# Patient Record
Sex: Female | Born: 1980 | Race: White | Hispanic: No | Marital: Married | Smoking: Never smoker
Health system: Southern US, Community
[De-identification: ages and names within clinical notes are randomized; demographics above are authoritative.]

## PROBLEM LIST (undated history)

## (undated) DIAGNOSIS — Z8619 Personal history of other infectious and parasitic diseases: Secondary | ICD-10-CM

## (undated) DIAGNOSIS — Z975 Presence of (intrauterine) contraceptive device: Secondary | ICD-10-CM

## (undated) HISTORY — DX: Personal history of other infectious and parasitic diseases: Z86.19

## (undated) HISTORY — PX: WISDOM TOOTH EXTRACTION: SHX21

## (undated) HISTORY — DX: Presence of (intrauterine) contraceptive device: Z97.5

---

## 2006-11-23 ENCOUNTER — Observation Stay: Payer: Self-pay

## 2006-11-24 ENCOUNTER — Inpatient Hospital Stay: Payer: Self-pay

## 2009-03-31 ENCOUNTER — Emergency Department: Payer: Self-pay | Admitting: Emergency Medicine

## 2010-06-27 LAB — HM PAP SMEAR

## 2010-12-04 ENCOUNTER — Ambulatory Visit (INDEPENDENT_AMBULATORY_CARE_PROVIDER_SITE_OTHER): Payer: BC Managed Care – PPO | Admitting: Family Medicine

## 2010-12-04 ENCOUNTER — Encounter: Payer: Self-pay | Admitting: Family Medicine

## 2010-12-04 DIAGNOSIS — G43109 Migraine with aura, not intractable, without status migrainosus: Secondary | ICD-10-CM | POA: Insufficient documentation

## 2010-12-04 DIAGNOSIS — Z23 Encounter for immunization: Secondary | ICD-10-CM

## 2010-12-04 DIAGNOSIS — Z975 Presence of (intrauterine) contraceptive device: Secondary | ICD-10-CM | POA: Insufficient documentation

## 2010-12-04 NOTE — Patient Instructions (Addendum)
Try to get regular exercise.  I would get a flu shot each fall.   Let me know if you have concerns.  We'll request your records.   Take care.  Glad to see you.

## 2010-12-04 NOTE — Assessment & Plan Note (Signed)
Pt to check if it was a Td or Tdap.

## 2010-12-04 NOTE — Assessment & Plan Note (Signed)
Controlled with rare flare.  Doing well with abortive tx.  Will call back if inc in frequency.  Nonsmoker, d/w pt about exercise.  Okay for outpatient fu.  Requesting records.

## 2010-12-04 NOTE — Progress Notes (Signed)
New to est.  H/o migraines, with aura.  Much improved frequency with IUD.  1-2/month, resolved with prn fioricet.  Doing well o/w.  Nonsmoker.   H/o IUD per Gyn.   Recently to UC with +RST, started on omnicef.  Now w/o ST and back to baseline.   PMH and SH reviewed  ROS: See HPI, otherwise noncontributory.  Meds, vitals, and allergies reviewed.   GEN: nad, alert and oriented HEENT: mucous membranes moist, op wnl NECK: supple w/o LA CV: rrr.  no murmur PULM: ctab, no inc wob ABD: soft, +bs EXT: no edema SKIN: no acute rash

## 2011-03-01 ENCOUNTER — Encounter: Payer: Self-pay | Admitting: Family Medicine

## 2011-03-21 ENCOUNTER — Ambulatory Visit (INDEPENDENT_AMBULATORY_CARE_PROVIDER_SITE_OTHER): Payer: BC Managed Care – PPO | Admitting: Family Medicine

## 2011-03-21 ENCOUNTER — Encounter: Payer: Self-pay | Admitting: Family Medicine

## 2011-03-21 DIAGNOSIS — J029 Acute pharyngitis, unspecified: Secondary | ICD-10-CM | POA: Insufficient documentation

## 2011-03-21 MED ORDER — AZITHROMYCIN 250 MG PO TABS: ORAL_TABLET | ORAL | Status: AC

## 2011-03-21 NOTE — Patient Instructions (Signed)
Drink plenty of fluids, take tylenol as needed, and gargle with warm salt water for your throat.  This should gradually improve.  Take care.  Let us know if you have other concerns.  Start the zithromax today.

## 2011-03-21 NOTE — Progress Notes (Signed)
Mult sick exposures, children with strep.  Now with sx starting Friday with HA and ST.  Then dizzy with chills.  No documented fevers, but has had sweats.  Minimal cough.  Diffuse aches; minimal ear pain.    Amoxil allergy.    Meds, vitals, and allergies reviewed.   ROS: See HPI.  Otherwise, noncontributory.  nad ncat Tm wnl Nasal exam with mild irritation Op with cobblestoning and erythema, no exudates Neck with LA noted rrr ctab Ext well perfused

## 2011-03-21 NOTE — Assessment & Plan Note (Signed)
Presumed stared with ST, tender LA, no cough, presumed fevers.  Known exposure.  Supportive tx and zmax, encouraged flu shot after recovery.  Nontoxic.  She agrees with plan.

## 2012-04-09 ENCOUNTER — Ambulatory Visit (INDEPENDENT_AMBULATORY_CARE_PROVIDER_SITE_OTHER): Payer: BC Managed Care – PPO | Admitting: Family Medicine

## 2012-04-09 ENCOUNTER — Encounter: Payer: Self-pay | Admitting: Family Medicine

## 2012-04-09 VITALS — BP 90/62 | HR 81 | Temp 98.6°F | Wt 130.0 lb

## 2012-04-09 DIAGNOSIS — J029 Acute pharyngitis, unspecified: Secondary | ICD-10-CM

## 2012-04-09 LAB — POCT RAPID STREP A (OFFICE): Rapid Strep A Screen: NEGATIVE

## 2012-04-09 MED ORDER — CEFDINIR 300 MG PO CAPS: 600.0000 mg | ORAL_CAPSULE | Freq: Every day | ORAL | Status: DC

## 2012-04-09 NOTE — Progress Notes (Signed)
Nature conservation officer at [facility] [address] [place] [address] Phone: [phone] Fax: [phone]  Date:  04/09/2012   Name:  Renee Carr   DOB:  Jan 05, 1981   MRN:  [id number] Gender: female Age: 31 y.o.  PCP:  Crawford Givens, MD  Evaluating MD: Hannah Beat, MD   Chief Complaint: Sore Throat   History of Present Illness:  Renee Carr is a 31 y.o. pleasant patient who presents with the following:  Sore throat, muscle soreness in neck. Very difficult to swallow. Significant pain, no nasal symptoms, no otalgia, no n/v/d, no rash  Patient Active Problem List  Diagnosis  . Migraine with aura  . IUD (intrauterine device) in place  . Tetanus-diphtheria (Td) vaccination  . Pharyngitis    Past Medical History  Diagnosis Date  . History of chicken pox   . Migraine   . IUD (intrauterine device) in place     mirena 04/2007 per gyn    Past Surgical History  Procedure Date  . Wisdom tooth extraction     History  Substance Use Topics  . Smoking status: Never Smoker   . Smokeless tobacco: Never Used  . Alcohol Use: Yes     Comment: very rarely    Family History  Problem Relation Age of Onset  . Healthy Mother   . Colon cancer Neg Hx     Allergies  Allergen Reactions  . Amoxicillin Hives    And joint pain; but has tolerated omnicef prev w/o ADE    Medication list has been reviewed and updated.  Outpatient Prescriptions Prior to Visit  Medication Sig Dispense Refill  . butalbital-acetaminophen-caffeine (FIORICET) 50-325-40 MG per tablet Take 1-2 by mouth every 4-6 hours as needed for pain       . Pediatric Multiple Vitamins (CHEWABLE MULTIPLE VITAMINS PO) Take by mouth 2 (two) times daily.         Last reviewed on 04/09/2012 12:59 PM by Consuello Masse, CMA  Review of Systems:  As above  Physical Examination: Filed Vitals:   04/09/12 1258  BP: 90/62  Pulse: 81  Temp: 98.6 F (37 C)  TempSrc: Oral  Weight: 130 lb (58.968 kg)      There is no height on file to calculate BMI. Ideal Body Weight:     Gen: WDWN, NAD; A & O x3, cooperative. Pleasant.Globally Non-toxic HEENT: Normocephalic and atraumatic. Throat: no exudate R TM clear, L TM - good landmarks, No fluid present. rhinnorhea. No frontal or maxillary sinus T. MMM NECK: Anterior cervical  LAD is present - TTP CV: RRR, No M/G/R, cap refill <2 sec PULM: Breathing comfortably in no respiratory distress. no wheezing, crackles, rhonchi ABD: S,NT,ND,+BS. No HSM. No rebound. EXT: No c/c/e PSYCH: Friendly, good eye contact   Assessment and Plan:  1. 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 POCT rapid strep A    Updated Medication List: (Includes new medications, updates to list, dose adjustments) Meds ordered this encounter  Medications  . cefdinir (OMNICEF) 300 MG capsule    Sig: Take 2 capsules (600 mg total) by  mouth daily.    Dispense:  20 capsule    Refill:  0    Medications Discontinued: There are no discontinued medications.    Hannah Beat, MD

## 2012-05-27 ENCOUNTER — Other Ambulatory Visit: Payer: Self-pay

## 2012-05-27 MED ORDER — BUTALBITAL-APAP-CAFFEINE 50-325-40 MG PO TABS: ORAL_TABLET | ORAL | Status: DC

## 2012-05-27 NOTE — Telephone Encounter (Signed)
Please call in.  F/u if not improved.  Thanks.

## 2012-05-27 NOTE — Telephone Encounter (Signed)
Rx phoned to pharmacy. Patient advised.

## 2012-05-27 NOTE — Telephone Encounter (Signed)
Pt said she has the beginnings of a migraine h/a and is out of med; pt said she understood Dr Para March to tell her if she needed refills she just needed to call. I offered pt an appt with Dr Para March but she said she would schedule appt but wanted to ask Dr Para March first if he would refill fiorcet to [facility].Please advise.

## 2012-11-13 ENCOUNTER — Ambulatory Visit (INDEPENDENT_AMBULATORY_CARE_PROVIDER_SITE_OTHER): Payer: BC Managed Care – PPO | Admitting: Family Medicine

## 2012-11-13 ENCOUNTER — Encounter: Payer: Self-pay | Admitting: Family Medicine

## 2012-11-13 VITALS — BP 92/58 | HR 88 | Temp 98.4°F | Ht 66.5 in | Wt 136.5 lb

## 2012-11-13 DIAGNOSIS — Z13220 Encounter for screening for lipoid disorders: Secondary | ICD-10-CM

## 2012-11-13 DIAGNOSIS — R5381 Other malaise: Secondary | ICD-10-CM

## 2012-11-13 DIAGNOSIS — Z0000 Encounter for general adult medical examination without abnormal findings: Secondary | ICD-10-CM

## 2012-11-13 DIAGNOSIS — Z131 Encounter for screening for diabetes mellitus: Secondary | ICD-10-CM

## 2012-11-13 NOTE — Progress Notes (Signed)
CPE- See plan.  Routine anticipatory guidance given to patient.  See health maintenance. Pap done 3/14 at gyn clinic.  Mammogram not due.   Colon cancer screening not due.   Tetanus shot done 2007. Flu shot d/w pt.   Diet and exercise d/w pt.  Healthy diet.  Exercising usually with dancing.   Living will.  Encouraged.  Would have husband designated if incapacitated.   Due for lipid and sugar check.    She has some post meal fatigue, also with some fatigue and need to nap throughout the day.  She thought taking B12 helped some but not fully.  Sleeping well. No clear trigger.  She is busy but life is generally good.    PMH and SH reviewed  Meds, vitals, and allergies reviewed.   ROS: See HPI.  Otherwise negative.    GEN: nad, alert and oriented HEENT: mucous membranes moist NECK: supple w/o LA CV: rrr. PULM: ctab, no inc wob ABD: soft, +bs EXT: no edema SKIN: no acute rash.  She has a 2mm brown macule on the R forearm, similar lesion 5mm across on the R shin.  Both appear uniform, benign.

## 2012-11-13 NOTE — Patient Instructions (Addendum)
Come back for fasting labs- schedule that on the way out.  We'll contact you with your lab report. I would get a flu shot each fall.    Take care.  Glad to see you.

## 2012-11-16 DIAGNOSIS — Z0000 Encounter for general adult medical examination without abnormal findings: Secondary | ICD-10-CM | POA: Insufficient documentation

## 2012-11-16 DIAGNOSIS — R5381 Other malaise: Secondary | ICD-10-CM | POA: Insufficient documentation

## 2012-11-16 DIAGNOSIS — Z0001 Encounter for general adult medical examination with abnormal findings: Secondary | ICD-10-CM | POA: Insufficient documentation

## 2012-11-16 NOTE — Assessment & Plan Note (Signed)
Possibly with workload/schedule component.  Check for reversible causes.    Return for labs.   She agrees.  Nontoxic. No tmg on exam.

## 2012-11-16 NOTE — Assessment & Plan Note (Signed)
Routine anticipatory guidance given to patient.  See health maintenance. Pap done 3/14 at gyn clinic.  Mammogram not due.   Colon cancer screening not due.   Tetanus shot done 2007. Flu shot d/w pt.   Diet and exercise d/w pt.  Healthy diet.  Exercising usually with dancing.   Living will.  Encouraged.  Would have husband designated if incapacitated.   Due for lipid and sugar check.   Return for labs.

## 2012-11-17 ENCOUNTER — Other Ambulatory Visit (INDEPENDENT_AMBULATORY_CARE_PROVIDER_SITE_OTHER): Payer: BC Managed Care – PPO

## 2012-11-17 DIAGNOSIS — R5383 Other fatigue: Secondary | ICD-10-CM

## 2012-11-17 DIAGNOSIS — Z13220 Encounter for screening for lipoid disorders: Secondary | ICD-10-CM

## 2012-11-17 DIAGNOSIS — Z131 Encounter for screening for diabetes mellitus: Secondary | ICD-10-CM

## 2012-11-17 LAB — LIPID PANEL
LDL Cholesterol: 94 mg/dL (ref 0–99)
Total CHOL/HDL Ratio: 3
VLDL: 8.4 mg/dL (ref 0.0–40.0)

## 2012-11-17 LAB — HEMOGLOBIN: Hemoglobin: 12.8 g/dL (ref 12.0–15.0)

## 2012-11-17 LAB — TSH: TSH: 3.71 u[IU]/mL (ref 0.35–5.50)

## 2012-11-17 LAB — VITAMIN B12: Vitamin B-12: 931 pg/mL — ABNORMAL HIGH (ref 211–911)

## 2015-08-11 ENCOUNTER — Encounter: Payer: Self-pay | Admitting: Obstetrics and Gynecology

## 2015-10-05 ENCOUNTER — Encounter: Payer: Self-pay | Admitting: Obstetrics and Gynecology

## 2015-10-13 ENCOUNTER — Encounter: Payer: Self-pay | Admitting: Obstetrics and Gynecology

## 2015-12-28 ENCOUNTER — Encounter: Payer: Self-pay | Admitting: Family Medicine

## 2015-12-28 ENCOUNTER — Ambulatory Visit (INDEPENDENT_AMBULATORY_CARE_PROVIDER_SITE_OTHER): Payer: BLUE CROSS/BLUE SHIELD | Admitting: Family Medicine

## 2015-12-28 VITALS — BP 108/64 | HR 87 | Temp 98.5°F | Ht 66.5 in | Wt 141.4 lb

## 2015-12-28 DIAGNOSIS — R002 Palpitations: Secondary | ICD-10-CM

## 2015-12-28 DIAGNOSIS — G43109 Migraine with aura, not intractable, without status migrainosus: Secondary | ICD-10-CM

## 2015-12-28 DIAGNOSIS — Z23 Encounter for immunization: Secondary | ICD-10-CM | POA: Diagnosis not present

## 2015-12-28 DIAGNOSIS — Z0000 Encounter for general adult medical examination without abnormal findings: Secondary | ICD-10-CM

## 2015-12-28 DIAGNOSIS — Z0001 Encounter for general adult medical examination with abnormal findings: Secondary | ICD-10-CM | POA: Diagnosis not present

## 2015-12-28 MED ORDER — BUTALBITAL-APAP-CAFFEINE 50-325-40 MG PO TABS: ORAL_TABLET | ORAL | 0 refills | Status: DC

## 2015-12-28 NOTE — Progress Notes (Signed)
CPE- See plan.  Routine anticipatory guidance given to patient.  See health maintenance. Pap done per gyn clinic.  Mammogram not due.   Colon cancer screening not due.   Tetanus shot 2017. Flu shot d/w pt.  Encouraged.    PNA and shingles not due.   Diet and exercise d/w pt.  Healthy diet.  Exercising usually with dancing.   Living will d/w pt.  Would have husband designated if she were incapacitated.   HCV screening not indicated.   HIV prev done with prenatal labs ~2008.  D/w pt.   She still has some fatigue.  No clear cause seen prev.  Unclear if this is a "normal" level of fatigue given work and life considerations.   She has occ palpitations, heart beating faster and stronger, for a few seconds, can happen at rest, randomly.  No exertional CP.  No SOB.  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  No syncope.  No presyncope.  Coffee in the AM, not much caffeine o/w.  She'll have episodes daily, or most days of the week.  Some days with mult episodes.  Not escalating in frequency, but going on for the last ~6 months.    occ migraine, had rarely used fioricet prev with relief.    PMH and SH reviewed  Meds, vitals, and allergies reviewed.   ROS: Per HPI.  Unless specifically indicated otherwise in HPI, the patient denies:  General: fever. Eyes: acute vision changes ENT: sore throat Cardiovascular: chest pain Respiratory: SOB GI: vomiting GU: dysuria Musculoskeletal: acute back pain Derm: acute rash Neuro: acute motor dysfunction Psych: worsening mood Endocrine: polydipsia Heme: bleeding Allergy: hayfever  GEN: nad, alert and oriented HEENT: mucous membranes moist NECK: supple w/o LA CV: rrr. PULM: ctab, no inc wob ABD: soft, +bs EXT: no edema SKIN: no acute rash

## 2015-12-28 NOTE — Patient Instructions (Addendum)
Please let us know if you need a referral for the gynecology clinic.  Please have them send us your next visit note.  Go to the lab on the way out.  We'll contact you with your lab report. Cut back on caffeine in the meantime.  I wouldn't quit cold [place].   Take care.  Glad to see you.

## 2015-12-29 DIAGNOSIS — R002 Palpitations: Secondary | ICD-10-CM | POA: Insufficient documentation

## 2015-12-29 LAB — BASIC METABOLIC PANEL
BUN: 12 mg/dL (ref 6–23)
CHLORIDE: 105 meq/L (ref 96–112)
CO2: 27 meq/L (ref 19–32)
CREATININE: 0.82 mg/dL (ref 0.40–1.20)
Calcium: 9.8 mg/dL (ref 8.4–10.5)
GFR: 83.69 mL/min (ref >60.00)
GLUCOSE: 75 mg/dL (ref 70–99)
Potassium: 4 mEq/L (ref 3.5–5.1)
Sodium: 140 mEq/L (ref 135–145)

## 2015-12-29 LAB — TSH: TSH: 2.43 u[IU]/mL (ref 0.35–4.50)

## 2015-12-29 LAB — HEMOGLOBIN: Hemoglobin: 12.5 g/dL (ref 12.0–15.0)

## 2015-12-29 NOTE — Assessment & Plan Note (Signed)
EKG unremarkable, TSH pending.  See notes on labs.  She'll cut back on caffeine and then we'll go from there.  We can address if sx continue (if labs wnl, if not better with less caffeine, etc).  She may have occ PVCs.  She doesn't have sustained sx, no CP, good exercise tolerance and is still okay for outpatient f/u.  D/w pt.

## 2015-12-29 NOTE — Assessment & Plan Note (Signed)
occ migraine, had rarely used fioricet prev with relief.  Okay to use prn, is used rarely.  If needed frequently, then she'll update me.

## 2015-12-29 NOTE — Assessment & Plan Note (Signed)
Pap done per gyn clinic.  Mammogram not due.   Colon cancer screening not due.   Tetanus shot 2017. Flu shot d/w pt.  Encouraged.    PNA and shingles not due.   Diet and exercise d/w pt.  Healthy diet.  Exercising usually with dancing.   Living will d/w pt.  Would have husband designated if she were incapacitated.   HCV screening not indicated.   HIV prev done with prenatal labs ~2008.  D/w pt.   She still has some fatigue.  No clear cause seen prev.  Unclear if this is a "normal" level of fatigue given work and life considerations.

## 2016-11-22 ENCOUNTER — Ambulatory Visit (INDEPENDENT_AMBULATORY_CARE_PROVIDER_SITE_OTHER): Payer: BLUE CROSS/BLUE SHIELD | Admitting: Obstetrics and Gynecology

## 2016-11-22 ENCOUNTER — Other Ambulatory Visit: Payer: Self-pay | Admitting: Obstetrics and Gynecology

## 2016-11-22 ENCOUNTER — Encounter: Payer: Self-pay | Admitting: Obstetrics and Gynecology

## 2016-11-22 VITALS — BP 93/61 | HR 75 | Ht 66.5 in | Wt 143.0 lb

## 2016-11-22 DIAGNOSIS — Z01419 Encounter for gynecological examination (general) (routine) without abnormal findings: Secondary | ICD-10-CM

## 2016-11-22 DIAGNOSIS — Z30431 Encounter for routine checking of intrauterine contraceptive device: Secondary | ICD-10-CM

## 2016-11-22 NOTE — Patient Instructions (Signed)
  Place annual gynecologic exam patient instructions here.  Thank you for enrolling in MyChart. Please follow the instructions below to securely access your online medical record. MyChart allows you to send messages to your doctor, view your test results, manage appointments, and more.   How Do I Sign Up? 1. In your Internet browser, go to [employer] Address [employer] and enter [email or web address]. 2. Click on the Sign Up Now link in the Sign In box. You will see the New Member Sign Up page. 3. Enter your MyChart Access Code exactly as it appears below. You will not need to use this code after you've completed the sign-up process. If you do not sign up before the expiration date, you must request a new code.  MyChart Access Code: [email or web address] Expires: 01/21/2017  2:54 PM  4. Enter your Social Security Number ([id number]) and Date of Birth (mm/dd/yyyy) as indicated and click Submit. You will be taken to the next sign-up page. 5. Create a MyChart ID. This will be your MyChart login ID and cannot be changed, so think of one that is secure and easy to remember. 6. Create a MyChart password. You can change your password at any time. 7. Enter your Password Reset Question and Answer. This can be used at a later time if you forget your password.  8. Enter your e-mail address. You will receive e-mail notification when new information is available in MyChart. 9. Click Sign Up. You can now view your medical record.   Additional Information Remember, MyChart is NOT to be used for urgent needs. For medical emergencies, dial 911.

## 2016-11-22 NOTE — Progress Notes (Signed)
Subjective:   Renee Carr is a 36 y.o. G1P1 Caucasian female here for a routine well-woman exam.  No LMP recorded. Patient is not currently having periods (Reason: IUD).    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  Past Medical History Past Medical History:  Diagnosis Date  . History of chicken pox   . IUD (intrauterine device) in place    mirena 04/2007 per gyn  . Migraine    occ with aura    Past Surgical History Past Surgical History:  Procedure Laterality Date  . WISDOM TOOTH EXTRACTION      Gynecologic History G1P1  No LMP recorded. Patient is not currently having periods (Reason: IUD). Contraception: IUD Last Pap: 2012. Results were: normal   Obstetric History OB History  Gravida Para Term Preterm AB Living  1 1       1   SAB TAB Ectopic Multiple Live Births          1    # Outcome Date GA Lbr Len/2nd Weight Sex Delivery Anes PTL Lv  1 Para 2008    F Vag-Spont   LIV      Current Medications Current Outpatient Prescriptions on File Prior to Visit  Medication Sig Dispense Refill  . BIOTIN PO Take 2 capsules by mouth daily.    . butalbital-acetaminophen-caffeine (FIORICET) 50-325-40 MG tablet Take 1-2 by mouth every 4-6 hours as needed for pain (Patient not taking: Reported on 11/22/2016) 30 tablet 0   No current facility-administered medications on file prior to visit.     Review of Systems Patient denies any headaches, blurred vision, shortness of breath, chest pain, abdominal pain, problems with  bowel movements, urination, or intercourse.  Objective:  BP 93/61 (BP Location: Left Arm, Patient Position: Sitting, Cuff Size: Normal)   Pulse 75   Ht 5' 6.5" (1.689 m)   Wt 143 lb (64.9 kg)   BMI 22.74 kg/m  Physical Exam  General:  Well developed, well nourished, no acute distress. She is alert and oriented x3. Skin:  Warm and dry Neck:  Midline trachea, no thyromegaly or nodules Cardiovascular: Regular rate and rhythm, no murmur heard Lungs:  Effort normal, all lung fields clear to auscultation bilaterally Breasts:  No dominant palpable mass, retraction, or nipple discharge Abdomen:  Soft, non tender, no hepatosplenomegaly or masses Pelvic:  External genitalia is normal in appearance.  The vagina is normal in appearance. The cervix is bulbous, no CMT.  Thin prep pap is done with HR HPV cotesting. Uterus is felt to be normal size, shape, and contour.  No adnexal masses or tenderness noted.IUD string noted Extremities:  No swelling or varicosities noted Psych:  She has a normal mood and affect  Assessment:   Healthy well-woman exam IUD check   Plan:  Discussed replacing IUD, will call to schedule.  F/U 1 year for AE, or sooner if needed Mammogram ordered baseline   Melody Suzan NailerN Shambley, CNM

## 2016-11-23 LAB — COMPREHENSIVE METABOLIC PANEL
ALBUMIN: 4.9 g/dL (ref 3.5–5.5)
ALK PHOS: 56 IU/L (ref 39–117)
ALT: 11 IU/L (ref 0–32)
AST: 15 IU/L (ref 0–40)
Albumin/Globulin Ratio: 2 (ref 1.2–2.2)
BILIRUBIN TOTAL: 0.3 mg/dL (ref 0.0–1.2)
BUN / CREAT RATIO: 17 (ref 9–23)
BUN: 16 mg/dL (ref 6–20)
CHLORIDE: 101 mmol/L (ref 96–106)
CO2: 24 mmol/L (ref 20–29)
CREATININE: 0.92 mg/dL (ref 0.57–1.00)
Calcium: 9.3 mg/dL (ref 8.7–10.2)
GFR calc Af Amer: 92 mL/min/{1.73_m2} (ref >59)
GFR calc non Af Amer: 80 mL/min/{1.73_m2} (ref >59)
GLOBULIN, TOTAL: 2.4 g/dL (ref 1.5–4.5)
Glucose: 83 mg/dL (ref 65–99)
Potassium: 4 mmol/L (ref 3.5–5.2)
Sodium: 140 mmol/L (ref 134–144)
Total Protein: 7.3 g/dL (ref 6.0–8.5)

## 2016-11-23 LAB — LIPID PANEL
CHOLESTEROL TOTAL: 150 mg/dL (ref 100–199)
Chol/HDL Ratio: 2.4 ratio (ref 0.0–4.4)
HDL: 63 mg/dL (ref >39)
LDL Calculated: 79 mg/dL (ref 0–99)
Triglycerides: 39 mg/dL (ref 0–149)
VLDL CHOLESTEROL CAL: 8 mg/dL (ref 5–40)

## 2016-11-27 LAB — CYTOLOGY - PAP

## 2017-01-11 ENCOUNTER — Ambulatory Visit
Admission: RE | Admit: 2017-01-11 | Discharge: 2017-01-11 | Disposition: A | Discharge status: Home / Self Care | Payer: BLUE CROSS/BLUE SHIELD | Source: Ambulatory Visit | Attending: Obstetrics and Gynecology | Admitting: Obstetrics and Gynecology

## 2017-01-11 DIAGNOSIS — Z1231 Encounter for screening mammogram for malignant neoplasm of breast: Secondary | ICD-10-CM | POA: Insufficient documentation

## 2017-01-11 DIAGNOSIS — R928 Other abnormal and inconclusive findings on diagnostic imaging of breast: Secondary | ICD-10-CM | POA: Diagnosis not present

## 2017-01-11 DIAGNOSIS — Z01419 Encounter for gynecological examination (general) (routine) without abnormal findings: Secondary | ICD-10-CM | POA: Diagnosis not present

## 2017-01-15 ENCOUNTER — Other Ambulatory Visit: Payer: Self-pay | Admitting: Obstetrics and Gynecology

## 2017-01-15 DIAGNOSIS — R928 Other abnormal and inconclusive findings on diagnostic imaging of breast: Secondary | ICD-10-CM

## 2017-01-15 DIAGNOSIS — N6489 Other specified disorders of breast: Secondary | ICD-10-CM

## 2017-01-23 ENCOUNTER — Ambulatory Visit
Admission: RE | Admit: 2017-01-23 | Discharge: 2017-01-23 | Disposition: A | Discharge status: Home / Self Care | Payer: BLUE CROSS/BLUE SHIELD | Source: Ambulatory Visit | Attending: Obstetrics and Gynecology | Admitting: Obstetrics and Gynecology

## 2017-01-23 DIAGNOSIS — N6489 Other specified disorders of breast: Secondary | ICD-10-CM | POA: Insufficient documentation

## 2017-01-23 DIAGNOSIS — R928 Other abnormal and inconclusive findings on diagnostic imaging of breast: Secondary | ICD-10-CM | POA: Diagnosis not present

## 2017-11-27 ENCOUNTER — Ambulatory Visit (INDEPENDENT_AMBULATORY_CARE_PROVIDER_SITE_OTHER): Payer: BLUE CROSS/BLUE SHIELD | Admitting: Obstetrics and Gynecology

## 2017-11-27 ENCOUNTER — Encounter: Payer: Self-pay | Admitting: Obstetrics and Gynecology

## 2017-11-27 VITALS — BP 90/64 | HR 88 | Ht 66.0 in | Wt 144.2 lb

## 2017-11-27 DIAGNOSIS — R238 Other skin changes: Secondary | ICD-10-CM

## 2017-11-27 DIAGNOSIS — Z01419 Encounter for gynecological examination (general) (routine) without abnormal findings: Secondary | ICD-10-CM | POA: Diagnosis not present

## 2017-11-27 MED ORDER — FLUCONAZOLE 150 MG PO TABS: 150.0000 mg | ORAL_TABLET | Freq: Once | ORAL | 3 refills | Status: AC

## 2017-11-27 NOTE — Patient Instructions (Signed)
Preventive Care 18-39 Years, Female Preventive care refers to lifestyle choices and visits with your health care provider that can promote health and wellness. What does preventive care include?  A yearly physical exam. This is also called an annual well check.  Dental exams once or twice a year.  Routine eye exams. Ask your health care provider how often you should have your eyes checked.  Personal lifestyle choices, including: ? Daily care of your teeth and gums. ? Regular physical activity. ? Eating a healthy diet. ? Avoiding tobacco and drug use. ? Limiting alcohol use. ? Practicing safe sex. ? Taking vitamin and mineral supplements as recommended by your health care provider. What happens during an annual well check? The services and screenings done by your health care provider during your annual well check will depend on your age, overall health, lifestyle risk factors, and family history of disease. Counseling Your health care provider may ask you questions about your:  Alcohol use.  Tobacco use.  Drug use.  Emotional well-being.  Home and relationship well-being.  Sexual activity.  Eating habits.  Work and work Statistician.  Method of birth control.  Menstrual cycle.  Pregnancy history.  Screening You may have the following tests or measurements:  Height, weight, and BMI.  Diabetes screening. This is done by checking your blood sugar (glucose) after you have not eaten for a while (fasting).  Blood pressure.  Lipid and cholesterol levels. These may be checked every 5 years starting at age 38.  Skin check.  Hepatitis C blood test.  Hepatitis B blood test.  Sexually transmitted disease (STD) testing.  BRCA-related cancer screening. This may be done if you have a family history of breast, ovarian, tubal, or peritoneal cancers.  Pelvic exam and Pap test. This may be done every 3 years starting at age 38. Starting at age 30, this may be done  every 5 years if you have a Pap test in combination with an HPV test.  Discuss your test results, treatment options, and if necessary, the need for more tests with your health care provider. Vaccines Your health care provider may recommend certain vaccines, such as:  Influenza vaccine. This is recommended every year.  Tetanus, diphtheria, and acellular pertussis (Tdap, Td) vaccine. You may need a Td booster every 10 years.  Varicella vaccine. You may need this if you have not been vaccinated.  HPV vaccine. If you are 39 or younger, you may need three doses over 6 months.  Measles, mumps, and rubella (MMR) vaccine. You may need at least one dose of MMR. You may also need a second dose.  Pneumococcal 13-valent conjugate (PCV13) vaccine. You may need this if you have certain conditions and were not previously vaccinated.  Pneumococcal polysaccharide (PPSV23) vaccine. You may need one or two doses if you smoke cigarettes or if you have certain conditions.  Meningococcal vaccine. One dose is recommended if you are age 68-21 years and a first-year college student living in a residence hall, or if you have one of several medical conditions. You may also need additional booster doses.  Hepatitis A vaccine. You may need this if you have certain conditions or if you travel or work in places where you may be exposed to hepatitis A.  Hepatitis B vaccine. You may need this if you have certain conditions or if you travel or work in places where you may be exposed to hepatitis B.  Haemophilus influenzae type b (Hib) vaccine. You may need this  if you have certain risk factors.  Talk to your health care provider about which screenings and vaccines you need and how often you need them. This information is not intended to replace advice given to you by your health care provider. Make sure you discuss any questions you have with your health care provider. Document Released: 06/26/2001 Document Revised:  01/18/2016 Document Reviewed: 03/01/2015 Elsevier Interactive Patient Education  2018 Elsevier Inc.  

## 2017-11-27 NOTE — Progress Notes (Signed)
Subjective:   Renee Carr is a 37 y.o. G1P1 Caucasian female here for a routine well-woman exam.  No LMP recorded. (Menstrual status: IUD).    Current complaints: vaginal itching after spotting (menses) monthly, getting worse each month. Also has area on right hand between fingers 3 and 4, that has been there for months and is spreading. Started out as two small blisters, now is dry and cracking, extremely itchy. Has used OTC tea tree oil, eczema creams, etc with no improvement. PCP: Para March       doesn't desire labs  Social History: Sexual: heterosexual Marital Status: married Living situation: with family Occupation: Tourist information centre manager Tobacco/alcohol: no tobacco use Illicit drugs: no history of illicit drug use  The following portions of the patient's history were reviewed and updated as appropriate: allergies, current medications, past family history, past medical history, past social history, past surgical history and problem list.  Past Medical History Past Medical History:  Diagnosis Date  . History of chicken pox   . IUD (intrauterine device) in place    mirena 04/2007 per gyn  . Migraine    occ with aura    Past Surgical History Past Surgical History:  Procedure Laterality Date  . WISDOM TOOTH EXTRACTION      Gynecologic History G1P1  No LMP recorded. (Menstrual status: IUD). Contraception: IUD Last Pap: 2018. Results were: normal Last mammogram: 01/2017. Results were: normal   Obstetric History OB History  Gravida Para Term Preterm AB Living  1 1       1   SAB TAB Ectopic Multiple Live Births          1    # Outcome Date GA Lbr Len/2nd Weight Sex Delivery Anes PTL Lv  1 Para 2008    F Vag-Spont   LIV    Current Medications Current Outpatient Medications on File Prior to Visit  Medication Sig Dispense Refill  . levonorgestrel (MIRENA) 20 MCG/24HR IUD 1 each by Intrauterine route once.    Marland Kitchen BIOTIN PO Take 2 capsules by mouth daily.    .  butalbital-acetaminophen-caffeine (FIORICET) 50-325-40 MG tablet Take 1-2 by mouth every 4-6 hours as needed for pain (Patient not taking: Reported on 11/22/2016) 30 tablet 0   No current facility-administered medications on file prior to visit.     Review of Systems Patient denies any headaches, blurred vision, shortness of breath, chest pain, abdominal pain, problems with bowel movements, urination, or intercourse.  Objective:  BP 90/64   Pulse 88   Ht 5\' 6"  (1.676 m)   Wt 144 lb 3.2 oz (65.4 kg)   BMI 23.27 kg/m  Physical Exam  General:  Well developed, well nourished, no acute distress. She is alert and oriented x3. Skin:  Warm and dry, with cracked red area noted on right hand between 3rd and 4th finger. Neck:  Midline trachea, no thyromegaly or nodules Cardiovascular: Regular rate and rhythm, no murmur heard Lungs:  Effort normal, all lung fields clear to auscultation bilaterally Breasts:  No dominant palpable mass, retraction, or nipple discharge Abdomen:  Soft, non tender, no hepatosplenomegaly or masses Pelvic:  External genitalia is normal in appearance.  The vagina is normal in appearance. The cervix is bulbous, no CMT. IUD string is noted. Thin prep pap is not done. Uterus is felt to be normal size, shape, and contour.  No adnexal masses or tenderness noted. Microscopic wet-mount exam shows negative for pathogens, normal epithelial cells, hyphae, lactobacilli. Extremities:  No swelling or varicosities  noted Psych:  She has a normal mood and affect  Assessment:   Healthy well-woman exam IUD check Vaginal yeast Right hand skin changes  Plan:  Diflucan sent in and instructed on use, recommend vagisil and RepHresh prn after menses. Referred to dermatologist to establish care and examine right hand. F/U 1 year for AE, or sooner if needed   Melody Suzan NailerN Shambley, CNM

## 2018-08-30 IMAGING — MG MM DIGITAL DIAGNOSTIC UNILAT*L* W/ TOMO W/ CAD
6 of 9 series · 6 of 21 positions shown · non-contrast
Comparison: Previous exam(s).

CLINICAL DATA: Callback from baseline screening mammogram for
possible left breast asymmetry

EXAM:
2D DIGITAL DIAGNOSTIC LEFT MAMMOGRAM WITH CAD AND ADJUNCT TOMO
ULTRASOUND LEFT BREAST

[L XCCL]
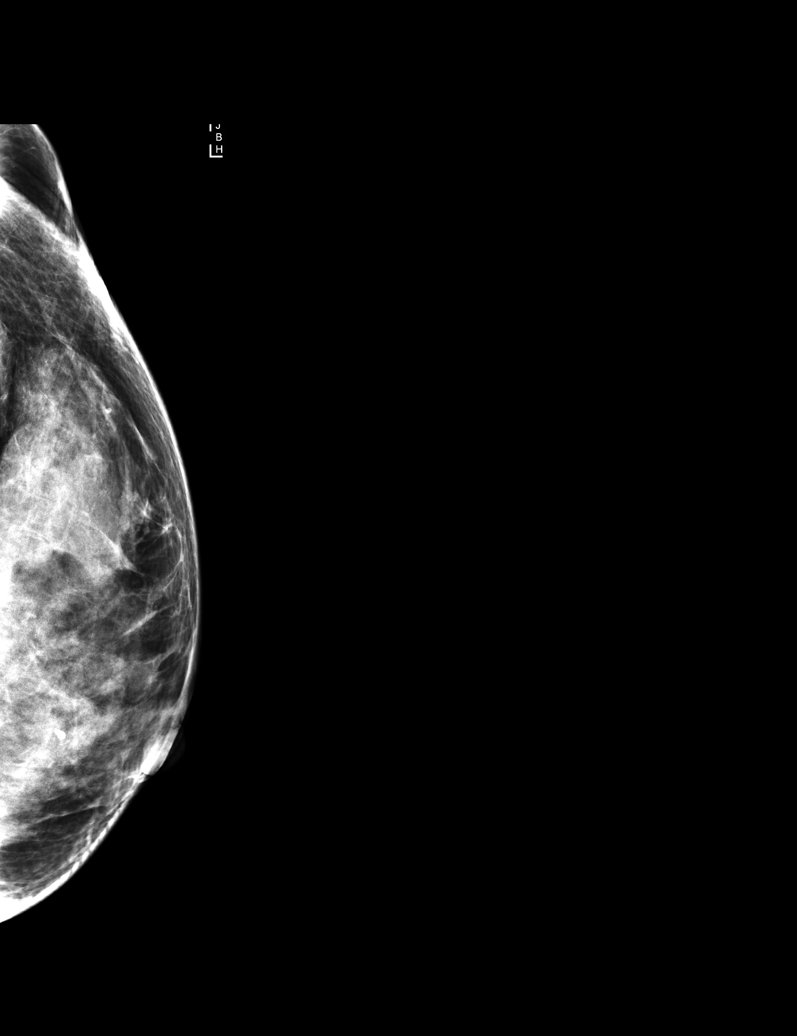

[L CC]
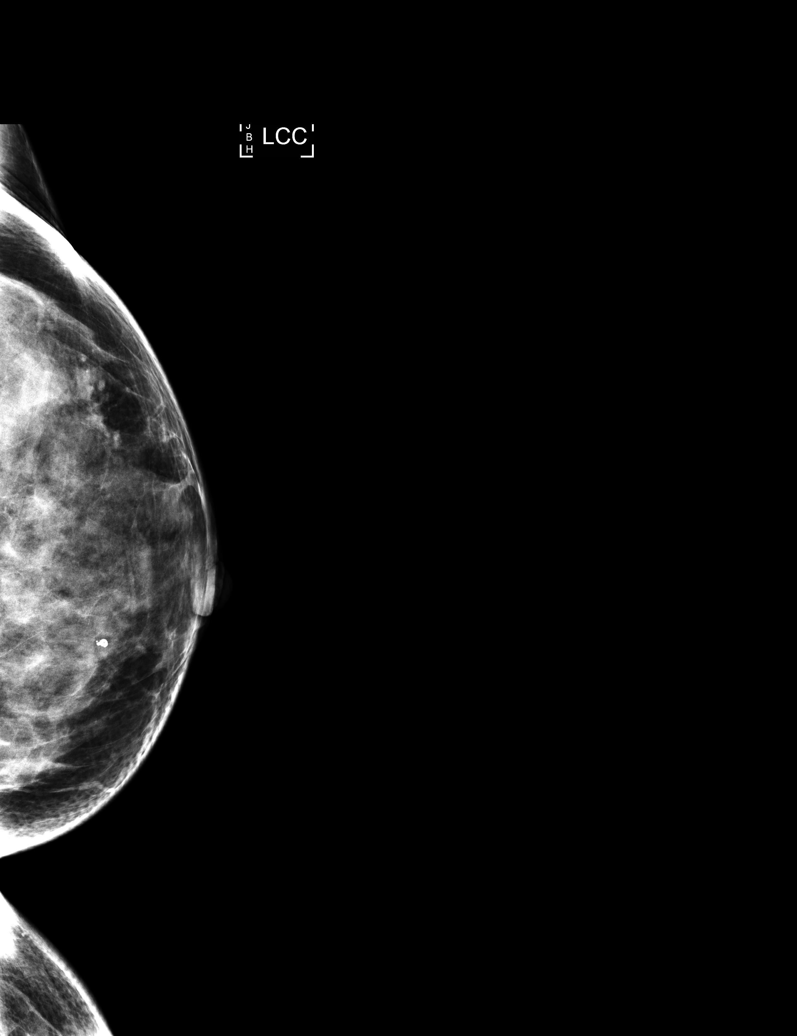

[L MLO]
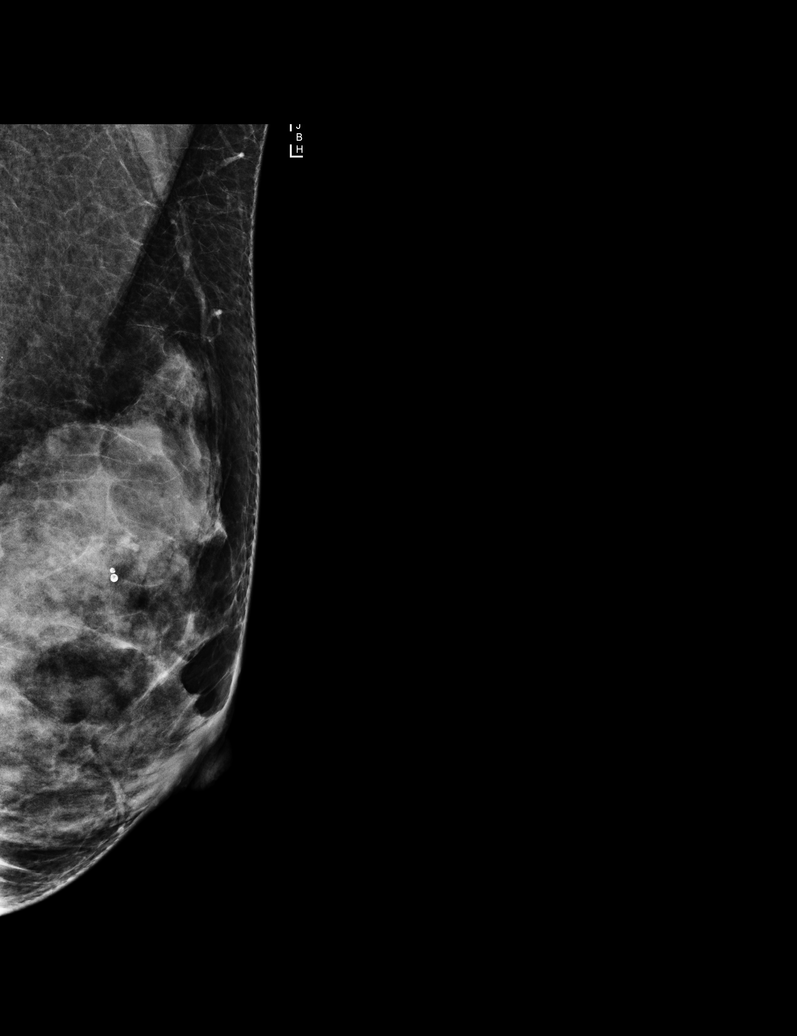

[L CC synth-2D]
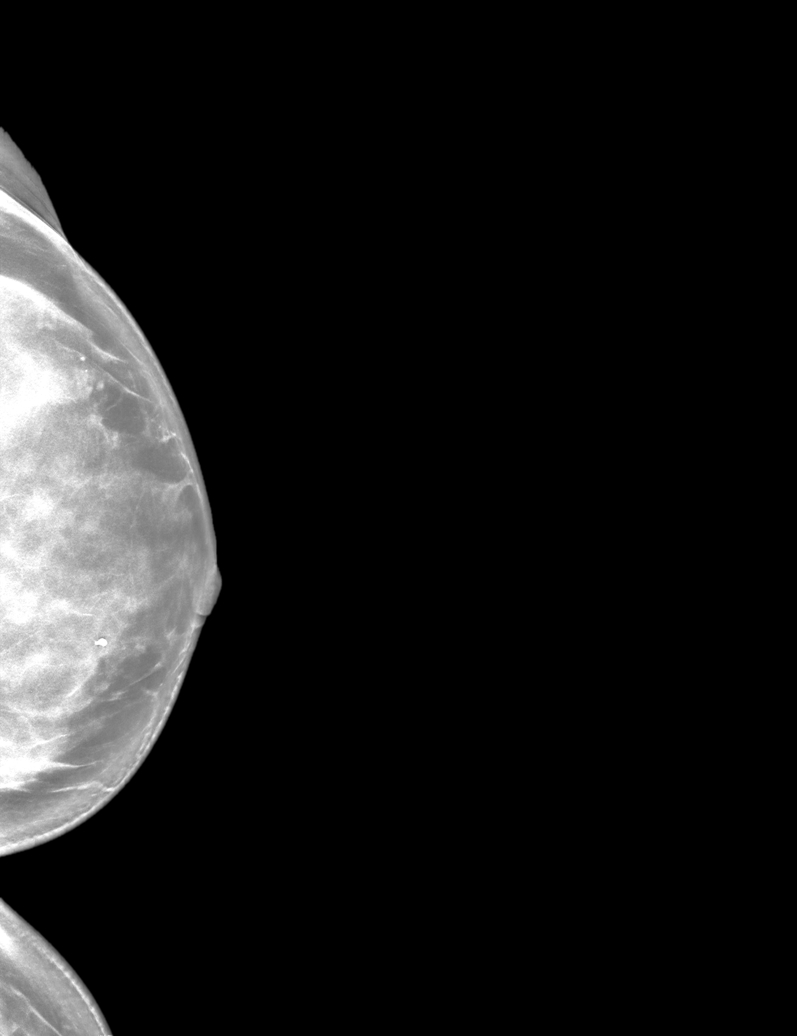

[L MLO synth-2D]
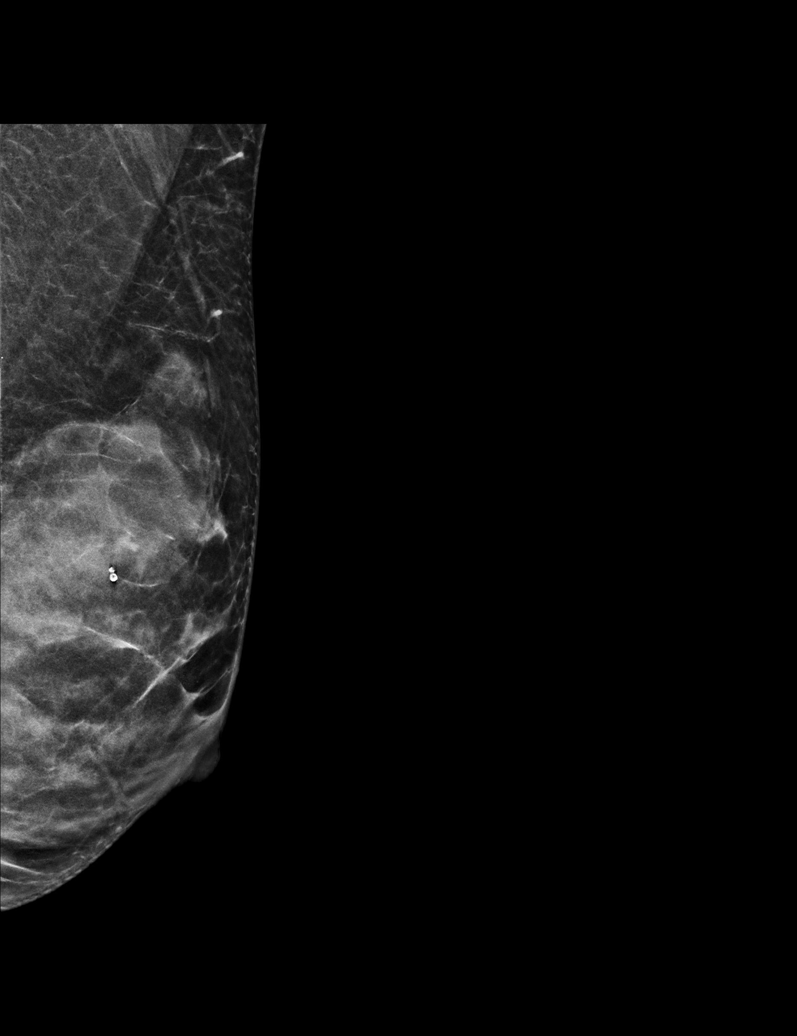

[L XCCL synth-2D]
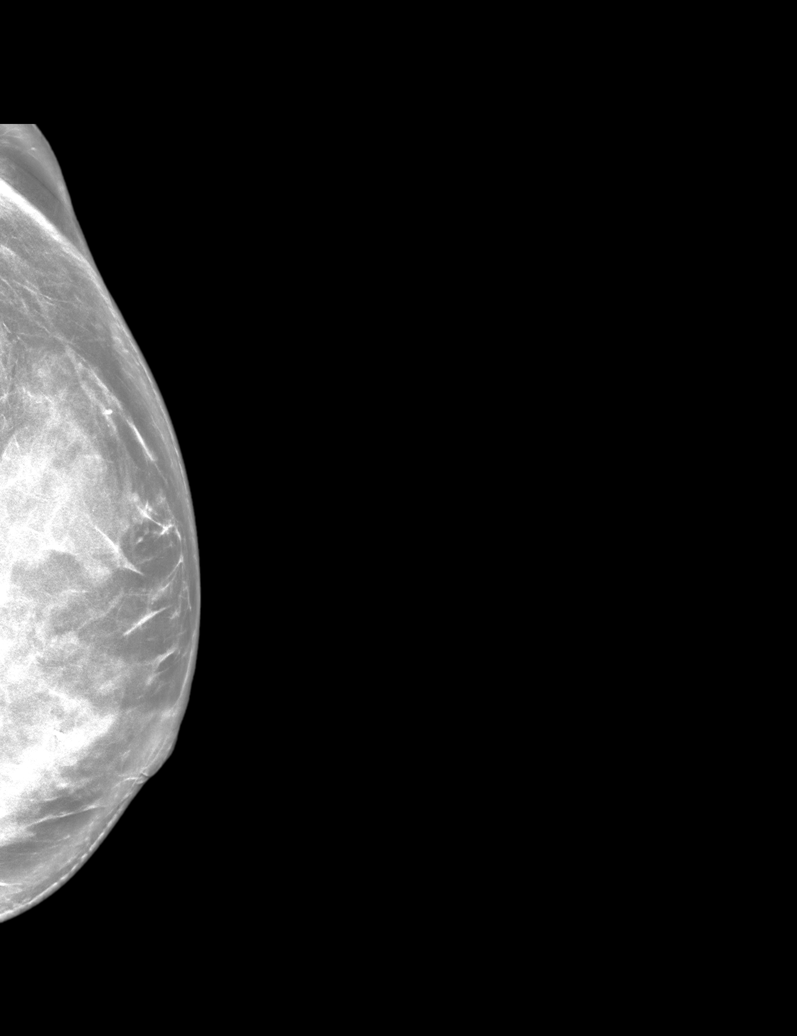

[6 of 21 positions shown; findings below may reference images not displayed]

ACR Breast Density Category d: The breast tissue is extremely dense,
which lowers the sensitivity of mammography.
FINDINGS: Cc, MLO, and XCCL views of the left breast were performed with
tomosynthesis. On the additional views, normal appearing dense
breast tissue is seen in the area of concern within the lateral left
breast.

Mammographic images were processed with CAD.

On physical exam, no discrete mass is felt in the area of concern
within the lateral left breast.

Targeted ultrasound of the upper, outer and lower, outer left breast
was performed demonstrating no suspicious cystic or solid
sonographic finding in the area of concern. Normal appearing dense
breast tissue was seen.
IMPRESSION: No mammographic or sonographic evidence of malignancy.

RECOMMENDATION:
Screening mammogram in one year.(Code:NE-9-C4O)

I have discussed the findings and recommendations with the patient.
Results were also provided in writing at the conclusion of the
visit. If applicable, a reminder letter will be sent to the patient
regarding the next appointment.

BI-RADS CATEGORY  1: Negative.

## 2018-12-03 ENCOUNTER — Encounter: Payer: BLUE CROSS/BLUE SHIELD | Admitting: Obstetrics and Gynecology

## 2018-12-10 ENCOUNTER — Other Ambulatory Visit (HOSPITAL_COMMUNITY)
Admission: RE | Admit: 2018-12-10 | Discharge: 2018-12-10 | Disposition: A | Discharge status: Home / Self Care | Payer: BC Managed Care – PPO | Source: Ambulatory Visit | Attending: Obstetrics and Gynecology | Admitting: Obstetrics and Gynecology

## 2018-12-10 ENCOUNTER — Other Ambulatory Visit: Payer: Self-pay

## 2018-12-10 ENCOUNTER — Encounter: Payer: Self-pay | Admitting: Obstetrics and Gynecology

## 2018-12-10 ENCOUNTER — Ambulatory Visit (INDEPENDENT_AMBULATORY_CARE_PROVIDER_SITE_OTHER): Payer: BC Managed Care – PPO | Admitting: Obstetrics and Gynecology

## 2018-12-10 VITALS — BP 96/78 | HR 78 | Ht 66.5 in | Wt 149.9 lb

## 2018-12-10 DIAGNOSIS — Z0000 Encounter for general adult medical examination without abnormal findings: Secondary | ICD-10-CM | POA: Diagnosis present

## 2018-12-10 DIAGNOSIS — Z975 Presence of (intrauterine) contraceptive device: Secondary | ICD-10-CM | POA: Diagnosis not present

## 2018-12-10 DIAGNOSIS — Z3009 Encounter for other general counseling and advice on contraception: Secondary | ICD-10-CM | POA: Diagnosis not present

## 2018-12-10 DIAGNOSIS — G43109 Migraine with aura, not intractable, without status migrainosus: Secondary | ICD-10-CM

## 2018-12-10 MED ORDER — LO LOESTRIN FE 1 MG-10 MCG / 10 MCG PO TABS: 1.0000 | ORAL_TABLET | Freq: Every day | ORAL | 11 refills | Status: DC

## 2018-12-10 NOTE — Progress Notes (Signed)
Subjective:     Renee Carr is a 38 y.o. female and is here for a comprehensive physical exam. The patient reports no problems.  Social History   Socioeconomic History  . Marital status: Married    Spouse name: Not on file  . Number of children: Not on file  . Years of education: Not on file  . Highest education level: Not on file  Occupational History  . Not on file  Social Needs  . Financial resource strain: Not on file  . Food insecurity    Worry: Not on file    Inability: Not on file  . Transportation needs    Medical: Not on file    Non-medical: Not on file  Tobacco Use  . Smoking status: Never Smoker  . Smokeless tobacco: Never Used  Substance and Sexual Activity  . Alcohol use: Yes    Comment: very rarely  . Drug use: No  . Sexual activity: Yes    Birth control/protection: I.U.D.    Comment: mirena  Lifestyle  . Physical activity    Days per week: Not on file    Minutes per session: Not on file  . Stress: Not on file  Relationships  . Social Herbalist on phone: Not on file    Gets together: Not on file    Attends religious service: Not on file    Active member of club or organization: Not on file    Attends meetings of clubs or organizations: Not on file    Relationship status: Not on file  . Intimate partner violence    Fear of current or ex partner: Not on file    Emotionally abused: Not on file    Physically abused: Not on file    Forced sexual activity: Not on file  Other Topics Concern  . 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 PAP SMEAR-Modifier  11/22/2017  . INFLUENZA VACCINE  12/13/2018  . TETANUS/TDAP  12/27/2025  . HIV Screening  Completed    The following portions of the patient's history were reviewed and updated as appropriate: allergies, current medications, past family history, past medical  history, past social history, past surgical history and problem list.  Review of Systems Pertinent items noted in HPI and remainder of comprehensive ROS otherwise negative.   Objective:    General appearance: alert, cooperative and appears stated age Head: Normocephalic, without obvious abnormality, atraumatic Throat: lips, mucosa, and tongue normal; teeth and gums normal Lungs: clear to auscultation bilaterally Breasts: normal appearance, no masses or tenderness Heart: regular rate and rhythm, S1, S2 normal, no murmur, click, rub or gallop Abdomen: soft, non-tender; bowel sounds normal; no masses,  no organomegaly Pelvic: cervix normal in appearance, external genitalia normal, no adnexal masses or tenderness, no cervical motion tenderness, rectovaginal septum normal, uterus normal size, shape, and consistency and vagina normal without discharge IUD strings noted    Assessment:    Healthy female exam Hx migraine with aura(none recently) IUD in place(placed 2013) Contraception counseling      Plan:  FU for IUD removal after starting LoLoEstrin for 1-2 months, plans spouse for vasectomy. Labs obtained- will follow up accordingly. FU in 1 yr for AE or sooner if needed   See After Visit Summary for Counseling Recommendations

## 2018-12-11 LAB — COMPREHENSIVE METABOLIC PANEL
ALT: 10 IU/L (ref 0–32)
AST: 13 IU/L (ref 0–40)
Albumin/Globulin Ratio: 2.1 (ref 1.2–2.2)
Albumin: 4.8 g/dL (ref 3.8–4.8)
Alkaline Phosphatase: 59 IU/L (ref 39–117)
BUN/Creatinine Ratio: 13 (ref 9–23)
BUN: 12 mg/dL (ref 6–20)
Bilirubin Total: 0.4 mg/dL (ref 0.0–1.2)
CO2: 21 mmol/L (ref 20–29)
Calcium: 9.5 mg/dL (ref 8.7–10.2)
Chloride: 101 mmol/L (ref 96–106)
Creatinine, Ser: 0.89 mg/dL (ref 0.57–1.00)
GFR calc Af Amer: 94 mL/min/{1.73_m2} (ref >59)
GFR calc non Af Amer: 82 mL/min/{1.73_m2} (ref >59)
Globulin, Total: 2.3 g/dL (ref 1.5–4.5)
Glucose: 88 mg/dL (ref 65–99)
Potassium: 4.2 mmol/L (ref 3.5–5.2)
Sodium: 138 mmol/L (ref 134–144)
Total Protein: 7.1 g/dL (ref 6.0–8.5)

## 2018-12-11 LAB — LIPID PANEL
Chol/HDL Ratio: 3.1 ratio (ref 0.0–4.4)
Cholesterol, Total: 172 mg/dL (ref 100–199)
HDL: 56 mg/dL (ref >39)
LDL Calculated: 105 mg/dL — ABNORMAL HIGH (ref 0–99)
Triglycerides: 57 mg/dL (ref 0–149)
VLDL Cholesterol Cal: 11 mg/dL (ref 5–40)

## 2018-12-11 LAB — HEMOGLOBIN A1C
Est. average glucose Bld gHb Est-mCnc: 105 mg/dL
Hgb A1c MFr Bld: 5.3 % (ref 4.8–5.6)

## 2018-12-11 LAB — TSH: TSH: 3.98 u[IU]/mL (ref 0.450–4.500)

## 2018-12-16 LAB — CYTOLOGY - PAP
Diagnosis: NEGATIVE
HPV: NOT DETECTED

## 2020-01-07 ENCOUNTER — Other Ambulatory Visit: Payer: Self-pay | Admitting: Obstetrics & Gynecology

## 2020-01-07 DIAGNOSIS — Z1231 Encounter for screening mammogram for malignant neoplasm of breast: Secondary | ICD-10-CM

## 2020-03-04 ENCOUNTER — Other Ambulatory Visit: Payer: Self-pay

## 2020-03-04 ENCOUNTER — Ambulatory Visit
Admission: RE | Admit: 2020-03-04 | Discharge: 2020-03-04 | Disposition: A | Discharge status: Home / Self Care | Payer: BC Managed Care – PPO | Source: Ambulatory Visit | Attending: Obstetrics & Gynecology | Admitting: Obstetrics & Gynecology

## 2020-03-04 DIAGNOSIS — Z1231 Encounter for screening mammogram for malignant neoplasm of breast: Secondary | ICD-10-CM | POA: Diagnosis present

## 2022-01-08 ENCOUNTER — Telehealth: Payer: Self-pay | Admitting: Family Medicine

## 2022-01-08 NOTE — Telephone Encounter (Signed)
After looking at patients chart she has not been seen since 2017; she would be considered a new patient now and would need to re-establish care. I also saw that patient was trying to establish care with [facility] clinic earlier this month but they did not have an NP openings until January and patient did not want to wait that long. Not sure if she is planning on staying your patient even after reestablishing care but also there are no open appts this week for patient to meet her deadline for this. I was advised that patient can get a CPE done with UC if she needs it done quickly.

## 2022-01-08 NOTE — Telephone Encounter (Signed)
Patient spouse Gregary Signs called in and stated that Adesuwa needs a physical done by August 31st for insurance reasons. I informed Gregary Signs that there are no available spots left but he wants to know if Dr. Para March can squeeze her in somewhere. Please advise. Thank you!

## 2022-01-08 NOTE — Telephone Encounter (Signed)
The issue isn't the time since last appointment.  I don't have slots on the schedule before the 31st.  Thanks.

## 2022-01-08 NOTE — Telephone Encounter (Signed)
Called and spoke with patients husband; okay per DPR. Advised him that we can not see her by requested date. Sean verbalized understanding.

## 2023-01-03 ENCOUNTER — Ambulatory Visit (INDEPENDENT_AMBULATORY_CARE_PROVIDER_SITE_OTHER): Payer: Commercial Managed Care - PPO | Admitting: Nurse Practitioner

## 2023-01-03 ENCOUNTER — Encounter: Payer: Self-pay | Admitting: Nurse Practitioner

## 2023-01-03 VITALS — BP 98/60 | HR 78 | Temp 98.5°F | Ht 66.5 in | Wt 129.6 lb

## 2023-01-03 DIAGNOSIS — N926 Irregular menstruation, unspecified: Secondary | ICD-10-CM

## 2023-01-03 DIAGNOSIS — Z13220 Encounter for screening for lipoid disorders: Secondary | ICD-10-CM

## 2023-01-03 DIAGNOSIS — Z862 Personal history of diseases of the blood and blood-forming organs and certain disorders involving the immune mechanism: Secondary | ICD-10-CM

## 2023-01-03 DIAGNOSIS — Z1231 Encounter for screening mammogram for malignant neoplasm of breast: Secondary | ICD-10-CM

## 2023-01-03 DIAGNOSIS — M25511 Pain in right shoulder: Secondary | ICD-10-CM

## 2023-01-03 DIAGNOSIS — Z0000 Encounter for general adult medical examination without abnormal findings: Secondary | ICD-10-CM | POA: Diagnosis not present

## 2023-01-03 DIAGNOSIS — Z1329 Encounter for screening for other suspected endocrine disorder: Secondary | ICD-10-CM

## 2023-01-03 DIAGNOSIS — Z0001 Encounter for general adult medical examination with abnormal findings: Secondary | ICD-10-CM

## 2023-01-03 NOTE — Progress Notes (Signed)
Bethanie Dicker, NP-C Phone: [phone]  Renee Carr is a 42 y.o. female who presents today to establish care and for annual exam.   Shoulder pain- Patient with right shoulder pain since February. 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 Her pain is located in the front of her shoulder joint and radiates down her right arm. It is worse with excess use. She has tried using heat and gentle massage with little relief in her symptoms. She has difficulty sleeping on her right side due to the pain.   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 Patient would like to return for this.  Mammogram: 03/04/2020- Due! Family history-  Colon cancer: No  Breast cancer: No  Ovarian cancer: No Menses: LMP- 12/23/2022- irregular cycles, have 2 cycles per month for the last 6-9 months, one cycle is usually heavier than the other.  Sexually active: Yes Vaccines-   Flu: Not due  Tetanus: 12/28/2015  COVID19: Never HIV screening: Negative Hep C Screening: Deferred Tobacco use: No Alcohol use: Yes, special occasions Illicit Drug use: No Dentist: Yes Ophthalmology: Yes  Active Ambulatory Problems    Diagnosis Date Noted   Migraine with aura 12/04/2010   Encounter for routine adult medical exam with abnormal findings 11/16/2012   Other malaise and fatigue 11/16/2012   Palpitations 12/29/2015   Right shoulder pain 01/10/2023   Hx of iron deficiency anemia 01/10/2023   Irregular menstrual bleeding 01/10/2023   Resolved Ambulatory Problems    Diagnosis Date Noted   IUD (intrauterine device) in place 12/04/2010   Tetanus-diphtheria (Td) vaccination 12/04/2010   Pharyngitis 03/21/2011   Past Medical History:  Diagnosis Date   History of chicken pox    Migraine     Family History  Problem Relation Age of Onset   Healthy Mother    Colon cancer Neg Hx    Breast cancer Neg Hx      Social History   Socioeconomic History   Marital status: Married    Spouse name: Not on file   Number of children: Not on file   Years of education: Not on file   Highest education level: Not on file  Occupational History   Not on file  Tobacco Use   Smoking status: Never   Smokeless tobacco: Never  Vaping Use   Vaping status: Never Used  Substance and Sexual Activity   Alcohol use: Yes    Comment: very rarely   Drug use: No   Sexual activity: Yes    Birth control/protection: I.U.D.    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 CV:  Negative for chest pain, dyspnea, edema, palpitations Resp: Negative for cough, dyspnea, hemoptysis GI: Negative for nausea, vomiting, diarrhea, constipation,  abdominal pain, melena, hematochezia. GU: Negative for dysuria, incontinence, urinary hesitance, hematuria, vaginal or penile discharge, polyuria, sexual difficulty, lumps in testicle or breasts MSK: Negative for muscle cramps or aches, swelling Neuro: Negative for headaches, weakness, numbness, dizziness, passing out/fainting Psych: Negative for depression, anxiety, memory problems  Objective  Physical Exam Vitals:   01/03/23 1544  BP: 98/60  Pulse: 78  Temp: 98.5 F (36.9 C)  SpO2: 97%    BP Readings from Last 3 Encounters:  01/03/23 98/60   12/10/18 96/78  11/27/17 90/64   Wt Readings from Last 3 Encounters:  01/03/23 129 lb 9.6 oz (58.8 kg)  12/10/18 149 lb 14.4 oz (68 kg)  11/27/17 144 lb 3.2 oz (65.4 kg)    Physical Exam Constitutional:      General: She is not in acute distress.    Appearance: Normal appearance.  HENT:     Head: Normocephalic.     Right Ear: Tympanic membrane normal.     Left Ear: Tympanic membrane normal.     Nose: Nose normal.     Mouth/Throat:     Mouth: Mucous membranes are moist.     Pharynx: Oropharynx is clear.  Eyes:     Conjunctiva/sclera: Conjunctivae normal.     Pupils: Pupils are equal, round, and reactive to light.  Neck:     Thyroid: No thyromegaly.  Cardiovascular:     Rate and Rhythm: Normal rate and regular rhythm.     Heart sounds: Normal heart sounds.  Pulmonary:     Effort: Pulmonary effort is normal.     Breath sounds: Normal breath sounds.  Abdominal:     General: Abdomen is flat. Bowel sounds are normal.     Palpations: Abdomen is soft. There is no mass.     Tenderness: There is no abdominal tenderness.  Musculoskeletal:     Right shoulder: Tenderness (anterior- ac joint) present. Decreased range of motion (pain with internal rotation).     Left shoulder: Normal.  Lymphadenopathy:     Cervical: No cervical adenopathy.  Skin:    General: Skin is warm and dry.     Findings: No rash.  Neurological:     General: No focal deficit present.     Mental Status: She is alert.  Psychiatric:        Mood and Affect: Mood normal.        Behavior: Behavior normal.    Assessment/Plan:   Encounter for routine adult medical exam with abnormal findings Assessment & Plan: Physical exam complete. Lab work as outlined. Will contact patient with results. Pap- due, she would like to return for this. Encouraged to schedule. Mammogram- due, order placed, encouraged patient to call to schedule. Flu vaccine not due. Tetanus vaccine- UTD. Declined all COVID vaccines. HIV  screening- negative. Hep C screening deferred. Recommended follow ups with Dentist and Ophthalmology for annual exams. Encouraged to continue healthy diet and exercise. Return to care for pap.   Orders: -     CBC with Differential/Platelet -     Comprehensive metabolic panel -     VITAMIN D 25 Hydroxy (Vit-D Deficiency, Fractures) -     Vitamin B12  Right shoulder pain, unspecified chronicity Assessment & Plan: Pain since February. Will refer to Ortho for further evaluation. Encouraged to continue ice/heat and massage as needed. She can take Tylenol/Ibuprofen as needed for pain.   Orders: -     Ambulatory referral to Orthopedics  Irregular menstrual bleeding Assessment & Plan: Having 2  cycles per month- one heavier than the other. She is not interested in birth control. She politely declined seeing Ob-Gyn for further evaluation at this time. Will continue to monitor.    Hx of iron deficiency anemia Assessment & Plan: Will check iron panel today. She is not taking an iron supplement.   Orders: -     IBC + Ferritin  Thyroid disorder screen -     TSH  Lipid screening -     Lipid panel  Screening mammogram for breast cancer -     3D Screening Mammogram, Left and Right; Future    Return for pap smear.   Bethanie Dicker, NP-C [facility] - [employer]

## 2023-01-04 LAB — IBC + FERRITIN
Ferritin: 11 ng/mL (ref 10.0–291.0)
Iron: 76 ug/dL (ref 42–145)
Saturation Ratios: 20.3 % (ref 20.0–50.0)
TIBC: 375.2 ug/dL (ref 250.0–450.0)
Transferrin: 268 mg/dL (ref 212.0–360.0)

## 2023-01-04 LAB — TSH: TSH: 1.67 u[IU]/mL (ref 0.35–5.50)

## 2023-01-04 LAB — COMPREHENSIVE METABOLIC PANEL
ALT: 15 U/L (ref 0–35)
AST: 16 U/L (ref 0–37)
Albumin: 4.3 g/dL (ref 3.5–5.2)
Alkaline Phosphatase: 46 U/L (ref 39–117)
BUN: 13 mg/dL (ref 6–23)
CO2: 28 mEq/L (ref 19–32)
Calcium: 9.7 mg/dL (ref 8.4–10.5)
Chloride: 101 meq/L (ref 96–112)
Creatinine, Ser: 0.93 mg/dL (ref 0.40–1.20)
GFR: 75.37 mL/min (ref >60.00)
Glucose, Bld: 84 mg/dL (ref 70–99)
Potassium: 3.9 meq/L (ref 3.5–5.1)
Sodium: 137 mEq/L (ref 135–145)
Total Bilirubin: 0.6 mg/dL (ref 0.2–1.2)
Total Protein: 7 g/dL (ref 6.0–8.3)

## 2023-01-04 LAB — CBC WITH DIFFERENTIAL/PLATELET
Basophils Absolute: 0.1 10*3/uL (ref 0.0–0.1)
Basophils Relative: 1.2 % (ref 0.0–3.0)
Eosinophils Absolute: 0.1 10*3/uL (ref 0.0–0.7)
Eosinophils Relative: 1.6 % (ref 0.0–5.0)
HCT: 37.5 % (ref 36.0–46.0)
Hemoglobin: 12.2 g/dL (ref 12.0–15.0)
Lymphocytes Relative: 36.4 % (ref 12.0–46.0)
Lymphs Abs: 2.3 10*3/uL (ref 0.7–4.0)
MCHC: 32.6 g/dL (ref 30.0–36.0)
MCV: 92.5 fl (ref 78.0–100.0)
Monocytes Absolute: 0.6 10*3/uL (ref 0.1–1.0)
Monocytes Relative: 9.4 % (ref 3.0–12.0)
Neutro Abs: 3.2 10*3/uL (ref 1.4–7.7)
Neutrophils Relative %: 51.4 % (ref 43.0–77.0)
Platelets: 250 10*3/uL (ref 150.0–400.0)
RBC: 4.05 Mil/uL (ref 3.87–5.11)
RDW: 12.6 % (ref 11.5–15.5)
WBC: 6.2 10*3/uL (ref 4.0–10.5)

## 2023-01-04 LAB — LIPID PANEL
Cholesterol: 180 mg/dL (ref 0–200)
HDL: 61.4 mg/dL (ref >39.00)
LDL Cholesterol: 109 mg/dL — ABNORMAL HIGH (ref 0–99)
NonHDL: 118.52
Total CHOL/HDL Ratio: 3
Triglycerides: 47 mg/dL (ref 0.0–149.0)
VLDL: 9.4 mg/dL (ref 0.0–40.0)

## 2023-01-04 LAB — VITAMIN D 25 HYDROXY (VIT D DEFICIENCY, FRACTURES): VITD: 42.46 ng/mL (ref 30.00–100.00)

## 2023-01-04 LAB — VITAMIN B12: Vitamin B-12: 292 pg/mL (ref 211–911)

## 2023-01-10 ENCOUNTER — Encounter: Payer: Self-pay | Admitting: Nurse Practitioner

## 2023-01-10 DIAGNOSIS — Z862 Personal history of diseases of the blood and blood-forming organs and certain disorders involving the immune mechanism: Secondary | ICD-10-CM | POA: Insufficient documentation

## 2023-01-10 DIAGNOSIS — N926 Irregular menstruation, unspecified: Secondary | ICD-10-CM | POA: Insufficient documentation

## 2023-01-10 DIAGNOSIS — M25511 Pain in right shoulder: Secondary | ICD-10-CM | POA: Insufficient documentation

## 2023-01-10 NOTE — Assessment & Plan Note (Signed)
Will check iron panel today. She is not taking an iron supplement.

## 2023-01-10 NOTE — Patient Instructions (Signed)
YOUR MAMMOGRAM IS DUE, PLEASE CALL AND GET THIS SCHEDULED! [facility] - call [phone]    

## 2023-01-10 NOTE — Assessment & Plan Note (Signed)
Having 2 cycles per month- one heavier than the other. She is not interested in birth control. She politely declined seeing Ob-Gyn for further evaluation at this time. Will continue to monitor.

## 2023-01-10 NOTE — Assessment & Plan Note (Signed)
Physical exam complete. Lab work as outlined. Will contact patient with results. Pap- due, she would like to return for this. Encouraged to schedule. Mammogram- due, order placed, encouraged patient to call to schedule. Flu vaccine not due. Tetanus vaccine- UTD. Declined all COVID vaccines. HIV screening- negative. Hep C screening deferred. Recommended follow ups with Dentist and Ophthalmology for annual exams. Encouraged to continue healthy diet and exercise. Return to care for pap.

## 2023-01-10 NOTE — Assessment & Plan Note (Signed)
Pain since February. Will refer to Ortho for further evaluation. Encouraged to continue ice/heat and massage as needed. She can take Tylenol/Ibuprofen as needed for pain.

## 2023-03-06 ENCOUNTER — Ambulatory Visit: Payer: Commercial Managed Care - PPO | Admitting: Nurse Practitioner

## 2023-03-06 ENCOUNTER — Telehealth: Payer: Self-pay | Admitting: Nurse Practitioner

## 2023-03-06 ENCOUNTER — Other Ambulatory Visit (HOSPITAL_COMMUNITY)
Admission: RE | Admit: 2023-03-06 | Discharge: 2023-03-06 | Disposition: A | Discharge status: Home / Self Care | Payer: Commercial Managed Care - PPO | Source: Ambulatory Visit | Attending: Nurse Practitioner | Admitting: Nurse Practitioner

## 2023-03-06 ENCOUNTER — Encounter: Payer: Self-pay | Admitting: Nurse Practitioner

## 2023-03-06 VITALS — BP 100/60 | HR 75 | Temp 98.6°F | Ht 66.5 in | Wt 129.0 lb

## 2023-03-06 DIAGNOSIS — Z113 Encounter for screening for infections with a predominantly sexual mode of transmission: Secondary | ICD-10-CM | POA: Insufficient documentation

## 2023-03-06 DIAGNOSIS — N926 Irregular menstruation, unspecified: Secondary | ICD-10-CM | POA: Diagnosis not present

## 2023-03-06 DIAGNOSIS — Z124 Encounter for screening for malignant neoplasm of cervix: Secondary | ICD-10-CM | POA: Insufficient documentation

## 2023-03-06 DIAGNOSIS — Z01419 Encounter for gynecological examination (general) (routine) without abnormal findings: Secondary | ICD-10-CM | POA: Diagnosis not present

## 2023-03-06 NOTE — Telephone Encounter (Signed)
Lft pt vm to call ofc to sch [place]. thanks

## 2023-03-06 NOTE — Assessment & Plan Note (Signed)
Pap completed today. Will contact patient with results. Mammogram- due, order placed at last visit. Encouraged patient to call to schedule this. Encouraged to continue healthy diet and exercise. Return to care for Annual Exam in August, sooner PRN.

## 2023-03-06 NOTE — Assessment & Plan Note (Signed)
Continues to have irregular menstrual cycles with occasionally 2 cycles per month and heavy bleeding. Will get Pelvic with TV US for further evaluation.

## 2023-03-06 NOTE — Patient Instructions (Signed)
YOUR MAMMOGRAM IS DUE, PLEASE CALL AND GET THIS SCHEDULED! [facility] - call [phone]    

## 2023-03-06 NOTE — Progress Notes (Signed)
Bethanie Dicker, NP-C Phone: [phone]  Renee Carr is a 42 y.o. female who presents today for pap smear.   [id number] Wt Readings from Last 3 Encounters:  03/06/23 129 lb (58.5 kg)  01/03/23 129 lb 9.6 oz (58.8 kg)  12/10/18 149 lb 14.4 oz (68 kg)   Last period: 02/12/2023 Regular periods: No- occasionally having two periods per month Heavy bleeding: Yes- one day  Sexually active: yes Birth control or hormonal therapy: No Hx of STD: Patient desires STD screening Dyspareunia: No Hot flashes: No Vaginal discharge: No Dysuria: No   Last mammogram: 03/04/2020- order placed, needs to schedule Breast mass or concerns: No Last Pap: 12/10/2018  History of abnormal pap: Yes- early 2000s  FH of breast, uterine, ovarian, colon cancer: No   Social History   Tobacco Use  Smoking Status Never  Smokeless Tobacco Never    No current outpatient medications on file prior to visit.   No current facility-administered medications on file prior to visit.    ROS see history of present illness  Objective  Physical Exam Vitals:   03/06/23 0859  BP: 100/60  Pulse: 75  Temp: 98.6 F (37 C)  SpO2: 97%    BP Readings from Last 3 Encounters:  03/06/23 100/60  01/03/23 98/60  12/10/18 96/78   Wt Readings from Last 3 Encounters:  03/06/23 129 lb (58.5 kg)  01/03/23 129 lb 9.6 oz (58.8 kg)  12/10/18 149 lb 14.4 oz (68 kg)    Physical Exam Exam conducted with a chaperone present Donavan Foil, CMA).  Constitutional:      General: She is not in acute distress.    Appearance: Normal appearance.  HENT:     Head: Normocephalic.  Cardiovascular:     Rate and Rhythm: Normal rate and regular rhythm.     Heart sounds: Normal heart sounds.  Pulmonary:     Effort: Pulmonary effort is normal.     Breath sounds: Normal breath sounds.  Genitourinary:    General: Normal vulva.     Pubic Area: No rash.      Labia:        Right: No rash or lesion.        Left: No rash or  lesion.      Vagina: Normal. No vaginal discharge or bleeding.     Cervix: No discharge, lesion or cervical bleeding.     Uterus: Normal.   Skin:    General: Skin is warm and dry.  Neurological:     General: No focal deficit present.     Mental Status: She is alert.  Psychiatric:        Mood and Affect: Mood normal.        Behavior: Behavior normal.    Assessment/Plan: Please see individual problem list.  Well woman exam with routine gynecological exam Assessment & Plan: Pap completed today. Will contact patient with results. Mammogram- due, order placed at last visit. Encouraged patient to call to schedule this. Encouraged to continue healthy diet and exercise. Return to care for Annual Exam in August, sooner PRN.    Irregular menstrual bleeding Assessment & Plan: Continues to have irregular menstrual cycles with occasionally 2 cycles per month and heavy bleeding. Will get Pelvic with TV US for further evaluation.  Orders: -     US PELVIC COMPLETE WITH TRANSVAGINAL; Future  Screening for cervical cancer -     Cytology - PAP   Return in about 10 months (around 01/04/2024) for Annual Exam,  sooner PRN.   Bethanie Dicker, NP-C [facility] - [employer]

## 2023-03-11 LAB — CYTOLOGY - PAP
Chlamydia: NEGATIVE
Comment: NEGATIVE
Comment: NEGATIVE
Comment: NEGATIVE
Comment: NORMAL
Diagnosis: NEGATIVE
High risk HPV: NEGATIVE
Neisseria Gonorrhea: NEGATIVE
Trichomonas: NEGATIVE

## 2023-04-08 ENCOUNTER — Ambulatory Visit: Payer: Commercial Managed Care - PPO
